# Patient Record
Sex: Female | Born: 1990 | Race: Black or African American | Hispanic: No | Marital: Single | Smoking: Never smoker
Health system: Southern US, Community
[De-identification: ages and names within clinical notes are randomized; demographics above are authoritative.]

## PROBLEM LIST (undated history)

## (undated) DIAGNOSIS — N83209 Unspecified ovarian cyst, unspecified side: Secondary | ICD-10-CM

---
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
## 2012-01-19 ENCOUNTER — Emergency Department (HOSPITAL_COMMUNITY)
Admission: EM | Admit: 2012-01-19 | Discharge: 2012-01-19 | Disposition: A | Discharge status: Home / Self Care | Payer: PRIVATE HEALTH INSURANCE | Attending: Emergency Medicine | Admitting: Emergency Medicine

## 2012-01-19 ENCOUNTER — Encounter (HOSPITAL_COMMUNITY): Payer: Self-pay | Admitting: Family Medicine

## 2012-01-19 DIAGNOSIS — R110 Nausea: Secondary | ICD-10-CM | POA: Diagnosis present

## 2012-01-19 LAB — COMPREHENSIVE METABOLIC PANEL
ALT: 11 U/L (ref 0–35)
AST: 17 U/L (ref 0–37)
Albumin: 4.6 g/dL (ref 3.5–5.2)
Alkaline Phosphatase: 58 U/L (ref 39–117)
BUN: 12 mg/dL (ref 6–23)
CO2: 24 mEq/L (ref 19–32)
Calcium: 10 mg/dL (ref 8.4–10.5)
Chloride: 103 mEq/L (ref 96–112)
Creatinine, Ser: 0.83 mg/dL (ref 0.50–1.10)
GFR calc Af Amer: >90 mL/min (ref >90)
GFR calc non Af Amer: >90 mL/min (ref >90)
Glucose, Bld: 86 mg/dL (ref 70–99)
Potassium: 3.5 mEq/L (ref 3.5–5.1)
Sodium: 140 mEq/L (ref 135–145)
Total Bilirubin: 0.5 mg/dL (ref 0.3–1.2)
Total Protein: 7.8 g/dL (ref 6.0–8.3)

## 2012-01-19 LAB — URINALYSIS, ROUTINE W REFLEX MICROSCOPIC
Glucose, UA: NEGATIVE mg/dL
Hgb urine dipstick: NEGATIVE
Ketones, ur: 15 mg/dL — AB
Leukocytes, UA: NEGATIVE
Nitrite: NEGATIVE
Protein, ur: NEGATIVE mg/dL
Specific Gravity, Urine: 1.034 — ABNORMAL HIGH (ref 1.005–1.030)
Urobilinogen, UA: 1 mg/dL (ref 0.0–1.0)
pH: 6 (ref 5.0–8.0)

## 2012-01-19 LAB — CBC WITH DIFFERENTIAL/PLATELET
Basophils Absolute: 0.1 10*3/uL (ref 0.0–0.1)
Basophils Relative: 1 % (ref 0–1)
Eosinophils Absolute: 0.1 10*3/uL (ref 0.0–0.7)
Eosinophils Relative: 2 % (ref 0–5)
HCT: 37.7 % (ref 36.0–46.0)
Hemoglobin: 12.8 g/dL (ref 12.0–15.0)
Lymphocytes Relative: 30 % (ref 12–46)
Lymphs Abs: 2.2 10*3/uL (ref 0.7–4.0)
MCH: 29.3 pg (ref 26.0–34.0)
MCHC: 34 g/dL (ref 30.0–36.0)
MCV: 86.3 fL (ref 78.0–100.0)
Monocytes Absolute: 0.7 10*3/uL (ref 0.1–1.0)
Monocytes Relative: 10 % (ref 3–12)
Neutro Abs: 4.3 10*3/uL (ref 1.7–7.7)
Neutrophils Relative %: 58 % (ref 43–77)
Platelets: 436 10*3/uL — ABNORMAL HIGH (ref 150–400)
RBC: 4.37 MIL/uL (ref 3.87–5.11)
RDW: 12.4 % (ref 11.5–15.5)
WBC: 7.4 10*3/uL (ref 4.0–10.5)

## 2012-01-19 LAB — POCT PREGNANCY, URINE: Preg Test, Ur: NEGATIVE

## 2012-01-19 MED ORDER — ONDANSETRON 4 MG PO TBDP
4.0000 mg | ORAL_TABLET | Freq: Once | ORAL | Status: AC
  Administered 2012-01-19: 4 mg via ORAL
  Filled 2012-01-19: qty 1

## 2012-01-19 MED ORDER — DEXTROSE 5 % AND 0.9 % NACL IV BOLUS: 1000.0000 mL | Freq: Once | INTRAVENOUS | Status: DC

## 2012-01-19 MED ORDER — ONDANSETRON 4 MG PO TBDP: ORAL_TABLET | ORAL | Status: AC

## 2012-01-19 NOTE — ED Notes (Signed)
Delay explained to pt multiple times. Patient advocate to waiting room to speak with pt

## 2012-01-19 NOTE — ED Provider Notes (Signed)
History     CSN: [id number]  Arrival date & time 01/19/12  1319   First MD Initiated Contact with Patient 01/19/12 1934      Chief Complaint  Patient presents with  . Nausea  . Dehydration    (Consider location/radiation/quality/duration/timing/severity/associated sxs/prior treatment) The history is provided by the patient.   the patient is a 21 year old female who presents with nausea for 4-5 days. She says her symptoms are constant in duration and mild in severity. She denies any associated symptoms including chest pain, shortness of breath, vomiting, diarrhea, or abdominal pain or dysuria. She states that nothing relieves her symptoms. Patient has had similar symptoms previously.  Past Medical History  Diagnosis Date  . Ovarian cyst     History reviewed. No pertinent past surgical history.  History reviewed. No pertinent family history.  History  Substance Use Topics  . Smoking status: Never Smoker   . Smokeless tobacco: Not on file  . Alcohol Use: No    OB History    Grav Para Term Preterm Abortions TAB SAB Ect Mult Living                  Review of Systems  Constitutional: Negative for fever and fatigue.  HENT: Negative for congestion, drooling and neck pain.   Eyes: Negative for pain.  Respiratory: Negative for cough and shortness of breath.   Cardiovascular: Negative for chest pain.  Gastrointestinal: Positive for nausea. Negative for vomiting, abdominal pain and diarrhea.  Genitourinary: Negative for dysuria and hematuria.  Musculoskeletal: Negative for back pain and gait problem.  Skin: Negative for color change.  Neurological: Negative for dizziness and headaches.  Hematological: Negative for adenopathy.  Psychiatric/Behavioral: Negative for behavioral problems.  All other systems reviewed and are negative.    Allergies  Zithromax  Home Medications  No current outpatient prescriptions on file.  BP 116/67  Pulse 86  Temp 98.4 F (36.9 C)  (Oral)  Resp 18  SpO2 100%  LMP 01/02/2012  Physical Exam  Nursing note and vitals reviewed. Constitutional: She is oriented to person, place, and time. She appears well-developed and well-nourished.  HENT:  Head: Normocephalic.  Mouth/Throat: Oropharynx is clear and moist. No oropharyngeal exudate.  Eyes: Conjunctivae and EOM are normal. Pupils are equal, round, and reactive to light.  Neck: Normal range of motion. Neck supple.  Cardiovascular: Normal rate, regular rhythm, normal heart sounds and intact distal pulses.  Exam reveals no gallop and no friction rub.   No murmur heard. Pulmonary/Chest: Effort normal and breath sounds normal. No respiratory distress. She has no wheezes.  Abdominal: Soft. Bowel sounds are normal. There is no tenderness. There is no rebound and no guarding.  Musculoskeletal: Normal range of motion. She exhibits no edema and no tenderness.  Neurological: She is alert and oriented to person, place, and time.  Skin: Skin is warm and dry.  Psychiatric: She has a normal mood and affect. Her behavior is normal.    ED Course  Procedures (including critical care time)  Labs Reviewed  URINALYSIS, ROUTINE W REFLEX MICROSCOPIC - Abnormal; Notable for the following:    Color, Urine AMBER (*)  BIOCHEMICALS MAY BE AFFECTED BY COLOR   APPearance CLOUDY (*)     Specific Gravity, Urine 1.034 (*)     Bilirubin Urine SMALL (*)     Ketones, ur 15 (*)     All other components within normal limits  CBC WITH DIFFERENTIAL - Abnormal; Notable for the following:  Platelets 436 (*)     All other components within normal limits  COMPREHENSIVE METABOLIC PANEL  POCT PREGNANCY, URINE   No results found.   No diagnosis found.    MDM  7:55 PM 21 y.o. female pw nausea for 4 days. She denies V/D/F. She notes she quit using ocp's approx 1 mos ago. She is AFVSS here, and appears well on exam. UA shows evidence of mild dehydration, labs otherwise non-contrib. Will plan for d/c  home w/ zofran.     I have discussed the diagnosis/risks/treatment options with the patient and family and believe the pt to be eligible for discharge home to follow-up with pcp as needed. We also discussed returning to the ED immediately if new or worsening sx occur. We discussed the sx which are most concerning (e.g., worsening nausea, or abd pain) that necessitate immediate return. Any new prescriptions provided to the patient are listed below.  New Prescriptions   ONDANSETRON (ZOFRAN ODT) 4 MG DISINTEGRATING TABLET    4mg  ODT q4 hours prn nausea/vomit   Clinical Impression 1. Nausea alone            Purvis Sheffield, MD 01/20/12 [phone]

## 2012-01-19 NOTE — ED Notes (Signed)
Per pt sts nausea, unable to eat anything, for a few days.

## 2012-01-19 NOTE — ED Notes (Signed)
Pt denies any complaints at this time

## 2012-01-25 NOTE — ED Provider Notes (Signed)
I saw and evaluated the patient, reviewed the resident's note and I agree with the findings and plan.  21 year-old female with nausea. Well appearing. No acute distress. Benign abdominal exam. Lungs are clear. Heart is regular without murmur. Workup significant for some possible mild dehydration. Otherwise reassuring. Very low suspicion for serious bacterial illness or acute surgical abdomen. She is hemodynamically stable. She is stable for discharge at this time.  Raeford Razor, MD 01/25/12 [phone]

## 2012-10-13 HISTORY — PX: DILATION AND CURETTAGE OF UTERUS: SHX78

## 2013-06-16 ENCOUNTER — Emergency Department (HOSPITAL_COMMUNITY): Payer: No Typology Code available for payment source

## 2013-06-16 ENCOUNTER — Encounter (HOSPITAL_COMMUNITY): Payer: Self-pay | Admitting: Emergency Medicine

## 2013-06-16 ENCOUNTER — Emergency Department (HOSPITAL_COMMUNITY)
Admission: EM | Admit: 2013-06-16 | Discharge: 2013-06-16 | Disposition: A | Discharge status: Home / Self Care | Payer: No Typology Code available for payment source | Attending: Emergency Medicine | Admitting: Emergency Medicine

## 2013-06-16 DIAGNOSIS — Y92410 Unspecified street and highway as the place of occurrence of the external cause: Secondary | ICD-10-CM | POA: Insufficient documentation

## 2013-06-16 DIAGNOSIS — Y9389 Activity, other specified: Secondary | ICD-10-CM | POA: Insufficient documentation

## 2013-06-16 DIAGNOSIS — S4980XA Other specified injuries of shoulder and upper arm, unspecified arm, initial encounter: Secondary | ICD-10-CM | POA: Insufficient documentation

## 2013-06-16 DIAGNOSIS — S46909A Unspecified injury of unspecified muscle, fascia and tendon at shoulder and upper arm level, unspecified arm, initial encounter: Secondary | ICD-10-CM | POA: Insufficient documentation

## 2013-06-16 DIAGNOSIS — S161XXA Strain of muscle, fascia and tendon at neck level, initial encounter: Secondary | ICD-10-CM

## 2013-06-16 DIAGNOSIS — S40019A Contusion of unspecified shoulder, initial encounter: Secondary | ICD-10-CM | POA: Insufficient documentation

## 2013-06-16 DIAGNOSIS — S139XXA Sprain of joints and ligaments of unspecified parts of neck, initial encounter: Secondary | ICD-10-CM | POA: Insufficient documentation

## 2013-06-16 DIAGNOSIS — S0990XA Unspecified injury of head, initial encounter: Secondary | ICD-10-CM | POA: Insufficient documentation

## 2013-06-16 DIAGNOSIS — Z8742 Personal history of other diseases of the female genital tract: Secondary | ICD-10-CM | POA: Insufficient documentation

## 2013-06-16 DIAGNOSIS — S40012A Contusion of left shoulder, initial encounter: Secondary | ICD-10-CM

## 2013-06-16 MED ORDER — HYDROCODONE-ACETAMINOPHEN 5-325 MG PO TABS: 2.0000 | ORAL_TABLET | Freq: Once | ORAL | Status: DC

## 2013-06-16 MED ORDER — HYDROCODONE-ACETAMINOPHEN 5-325 MG PO TABS: 2.0000 | ORAL_TABLET | ORAL | Status: AC | PRN
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
## 2013-06-16 NOTE — ED Notes (Signed)
Pt returned from radiology.
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
## 2013-06-16 NOTE — ED Notes (Signed)
Pt attempted to use bedpan, unable to go. On phone.
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
